# Patient Record
Sex: Male | Born: 2000 | Race: White | Hispanic: No | Marital: Single | Smoking: Never smoker
Health system: Southern US, Community
[De-identification: ages and names within clinical notes are randomized; demographics above are authoritative.]

---
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
## 2022-01-12 ENCOUNTER — Encounter (HOSPITAL_COMMUNITY): Payer: Self-pay

## 2022-01-12 ENCOUNTER — Other Ambulatory Visit: Payer: Self-pay

## 2022-01-12 ENCOUNTER — Emergency Department (HOSPITAL_COMMUNITY): Payer: Commercial Managed Care - PPO

## 2022-01-12 DIAGNOSIS — W502XXA Accidental twist by another person, initial encounter: Secondary | ICD-10-CM | POA: Diagnosis not present

## 2022-01-12 DIAGNOSIS — S93401A Sprain of unspecified ligament of right ankle, initial encounter: Secondary | ICD-10-CM | POA: Insufficient documentation

## 2022-01-12 DIAGNOSIS — Y9301 Activity, walking, marching and hiking: Secondary | ICD-10-CM | POA: Diagnosis not present

## 2022-01-12 DIAGNOSIS — S99911A Unspecified injury of right ankle, initial encounter: Secondary | ICD-10-CM | POA: Diagnosis present

## 2022-01-12 NOTE — ED Triage Notes (Signed)
Pt states he twisted his right ankle today, states its starting to swell and hurts to walk on it.

## 2022-01-13 ENCOUNTER — Emergency Department (HOSPITAL_COMMUNITY)
Admission: EM | Admit: 2022-01-13 | Discharge: 2022-01-13 | Disposition: A | Discharge status: Home / Self Care | Payer: Commercial Managed Care - PPO | Attending: Emergency Medicine | Admitting: Emergency Medicine

## 2022-01-13 DIAGNOSIS — S93401A Sprain of unspecified ligament of right ankle, initial encounter: Secondary | ICD-10-CM

## 2022-01-13 NOTE — Discharge Instructions (Signed)
Keep the ankle iced and elevated.  Take ibuprofen as needed for pain.  Make sure that you are wearing a well fitting shoe.

## 2022-01-13 NOTE — ED Provider Notes (Signed)
[facility] Provider Note   CSN: [id number] Arrival date & time: 01/12/22  2248     History  Chief Complaint  Patient presents with   Ankle Pain    Jerry Ramirez is a 21 y.o. male.  HPI     This is an 21 year old male who presents with right ankle and foot pain.  Patient reports that he twisted his right ankle while walking earlier this evening.  Is painful to walk.  Denies falling or hitting his head.  Denies knee pain.  He took some ibuprofen with some relief.  Denies numbness or tingling.  Home Medications Prior to Admission medications   Medication Sig Start Date End Date Taking? Authorizing Provider  loratadine-pseudoephedrine (CLARITIN-D 12 HOUR) 5-120 MG tablet Take 1 tablet by mouth 2 (two) times daily. 01/28/17   Lily Kocher, PA-C  naproxen (NAPROSYN) 375 MG tablet Take 1 tablet (375 mg total) by mouth 2 (two) times daily as needed. 02/27/20   Petrucelli, Glynda Jaeger, PA-C      Allergies    Patient has no known allergies.    Review of Systems   Review of Systems  Constitutional:  Negative for fever.  Musculoskeletal:        Ankle pain  All other systems reviewed and are negative.  Physical Exam Updated Vital Signs BP 132/90 (BP Location: Right Arm)    Pulse 89    Temp 97.7 F (36.5 C) (Oral)    Resp 15    Ht 1.829 m (6')    Wt 59.7 kg    SpO2 100%    BMI 17.86 kg/m  Physical Exam Vitals and nursing note reviewed.  Constitutional:      Appearance: He is well-developed. He is not ill-appearing.  HENT:     Head: Normocephalic and atraumatic.     Nose: Nose normal.     Mouth/Throat:     Mouth: Mucous membranes are moist.  Eyes:     Pupils: Pupils are equal, round, and reactive to light.  Cardiovascular:     Rate and Rhythm: Normal rate and regular rhythm.  Pulmonary:     Effort: Pulmonary effort is normal. No respiratory distress.  Musculoskeletal:     Comments: Tenderness to palpation over the medial malleolus as well as some  bruising inferiorly minimal swelling, 2+ DP pulse, range of motion limited secondary to pain  Skin:    General: Skin is warm and dry.  Neurological:     Mental Status: He is alert and oriented to person, place, and time.  Psychiatric:        Mood and Affect: Mood normal.    ED Results / Procedures / Treatments   Labs (all labs ordered are listed, but only abnormal results are displayed) Labs Reviewed - No data to display  EKG None  Radiology DG Ankle Complete Right  Result Date: 01/12/2022 CLINICAL DATA:  Ankle injury. twisted his right ankle today while walking down steps, states its starting to swell and hurts to walk on it. Pt denies any previous injury to right ankle. Pt states pain is worse and medial malleolus, bruising visible EXAM: RIGHT ANKLE - COMPLETE 3+ VIEW COMPARISON:  None. FINDINGS: There is no evidence of fracture, dislocation, or joint effusion. There is no evidence of arthropathy or other focal bone abnormality. Soft tissues are unremarkable. IMPRESSION: Negative. Electronically Signed   By: Iven Finn M.D.   On: 01/12/2022 23:25    Procedures Procedures    Medications Ordered  in ED Medications - No data to display  ED Course/ Medical Decision Making/ A&P                           Medical Decision Making Amount and/or Complexity of Data Reviewed Radiology: ordered.   This patient presents to the ED for concern of ankle pain, this involves an extensive number of treatment options, and is a complaint that carries with it a high risk of complications and morbidity.  The differential diagnosis includes sprain, fracture  MDM:    Patient presents with right ankle pain after twisting his ankle.  He is nontoxic and vital signs are reassuring.  Ankle has some bruising inferior to the medial malleolus with some moderate swelling.  Otherwise he is neurovascularly intact.  X-rays reviewed and showed no evidence of acute fracture.  Suspect sprain.  He was provided  with an ankle brace.  Recommend ice and elevation.  Anti-inflammatories. (Labs, imaging)  Labs: I Ordered, and personally interpreted labs.  The pertinent results include: None  Imaging Studies ordered: I ordered imaging studies including x-ray I independently visualized and interpreted imaging. I agree with the radiologist interpretation  Additional history obtained from family.  External records from outside source obtained and reviewed including N/A  Critical Interventions: N/A  Consultations: I requested consultation with the NA,  and discussed lab and imaging findings as well as pertinent plan - they recommend: N/A  Cardiac Monitoring: The patient was maintained on a cardiac monitor.  I personally viewed and interpreted the cardiac monitored which showed an underlying rhythm of: N/A  Reevaluation: After the interventions noted above, I reevaluated the patient and found that they have :improved   Considered admission for: N/A  Social Determinants of Health: Lives independently  Disposition: Discharge  Co morbidities that complicate the patient evaluation History reviewed. No pertinent past medical history.   Medicines No orders of the defined types were placed in this encounter.   I have reviewed the patients home medicines and have made adjustments as needed  Problem List / ED Course: Problem List Items Addressed This Visit   None Visit Diagnoses     Sprain of right ankle, unspecified ligament, initial encounter    -  Primary                   Final Clinical Impression(s) / ED Diagnoses Final diagnoses:  Sprain of right ankle, unspecified ligament, initial encounter    Rx / DC Orders ED Discharge Orders     None         Caelen Reierson, Barbette Hair, MD 01/13/22 0031

## 2023-08-03 IMAGING — DX DG ANKLE COMPLETE 3+V*R*
3 series · 3 of 3 positions shown · non-contrast
Comparison: None.

CLINICAL DATA: Ankle injury. twisted his right ankle today while
walking down steps, states its starting to swell and hurts to walk
on it. Pt denies any previous injury to right ankle. Pt states pain
is worse and medial malleolus, bruising visible

EXAM:
RIGHT ANKLE - COMPLETE 3+ VIEW

[ankle ap]
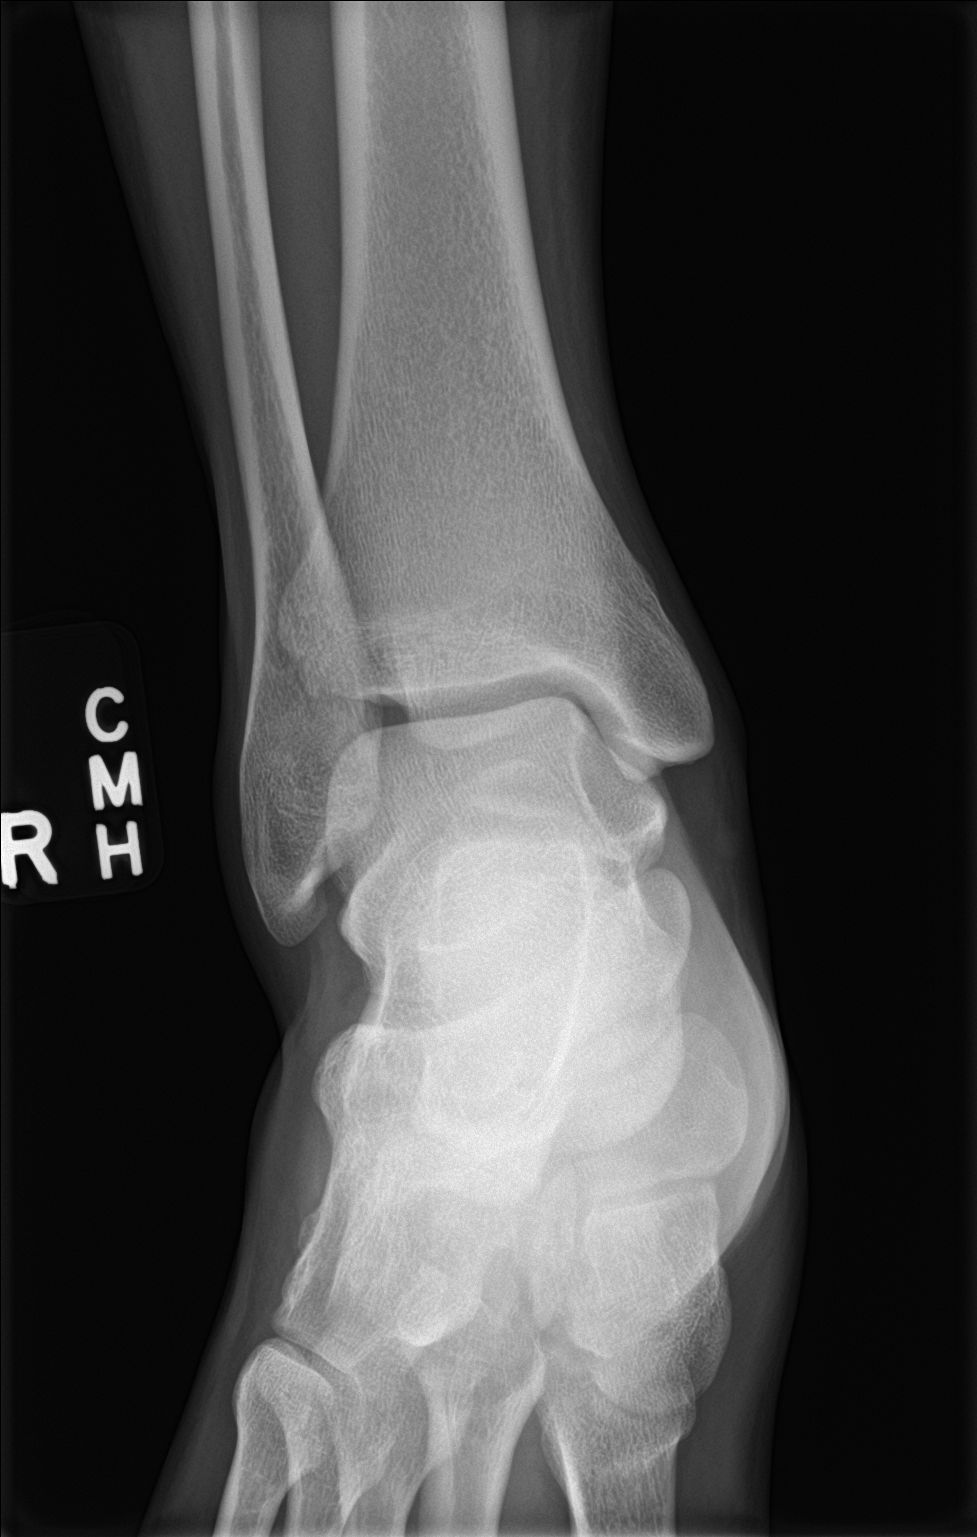

[ankle obl]
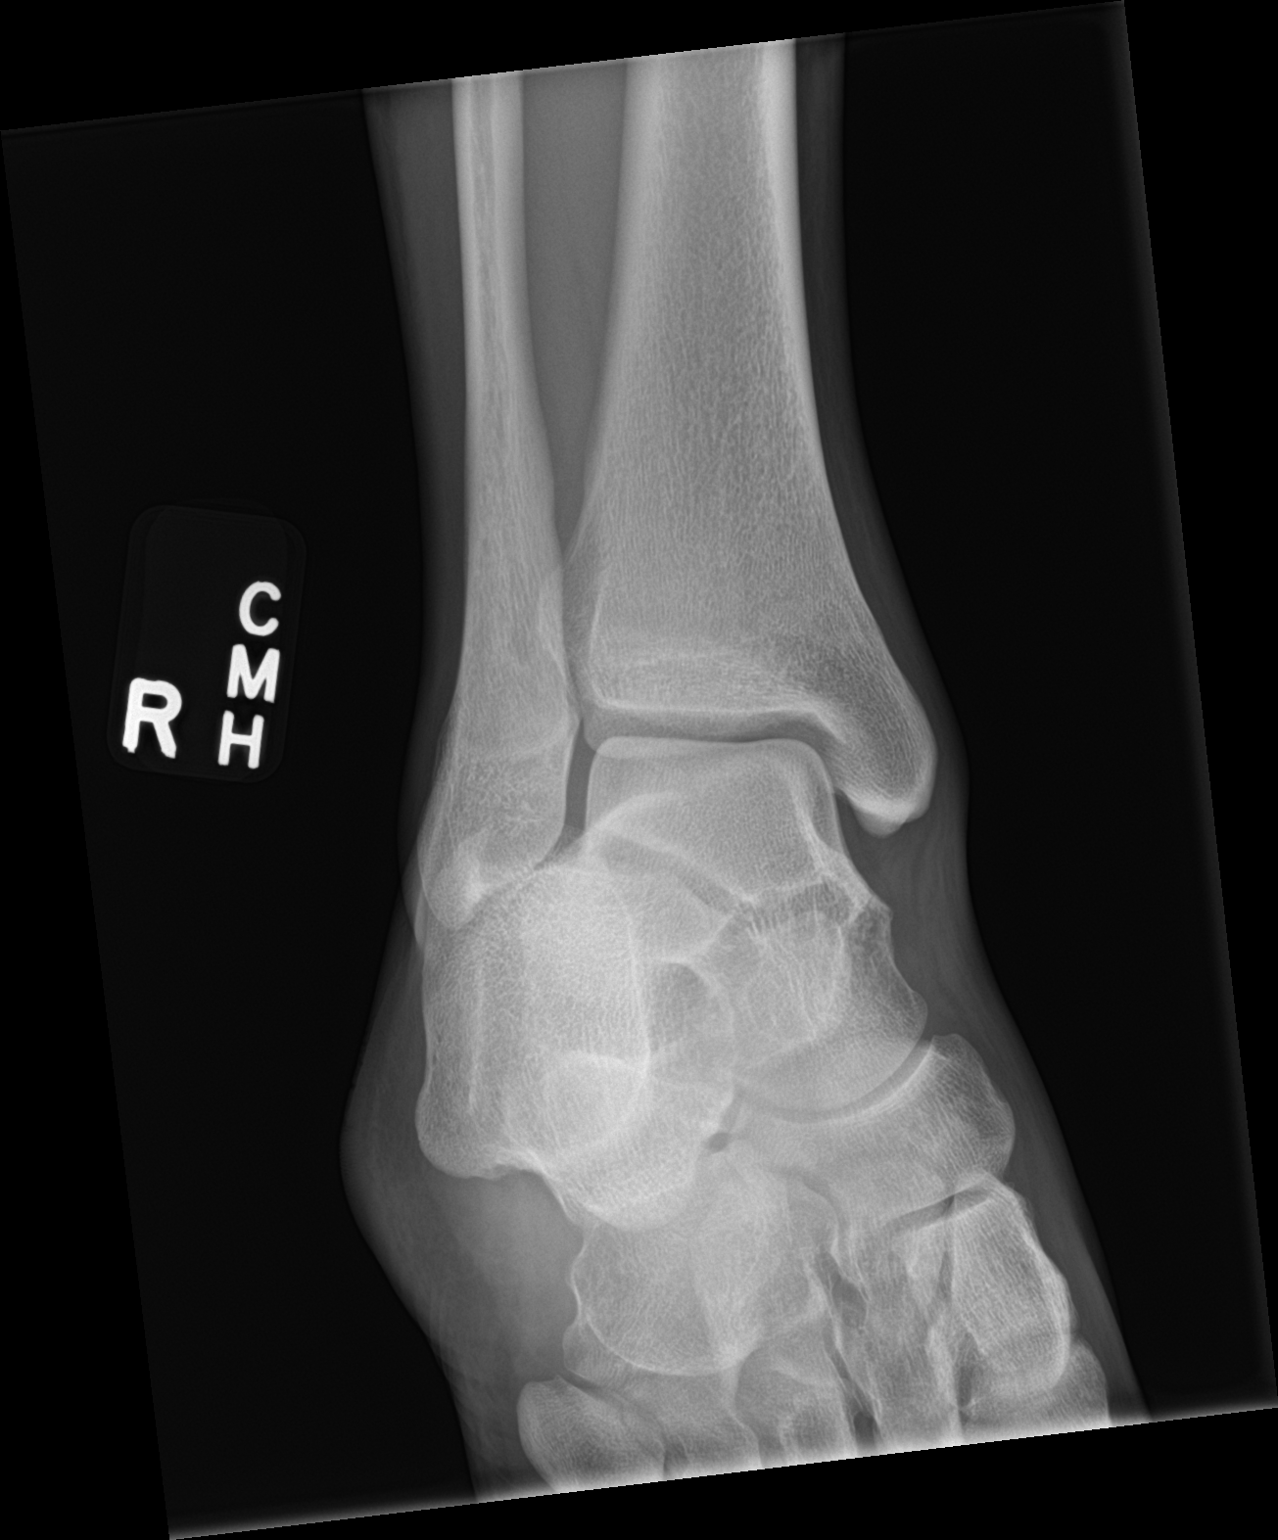

[ankle lat]
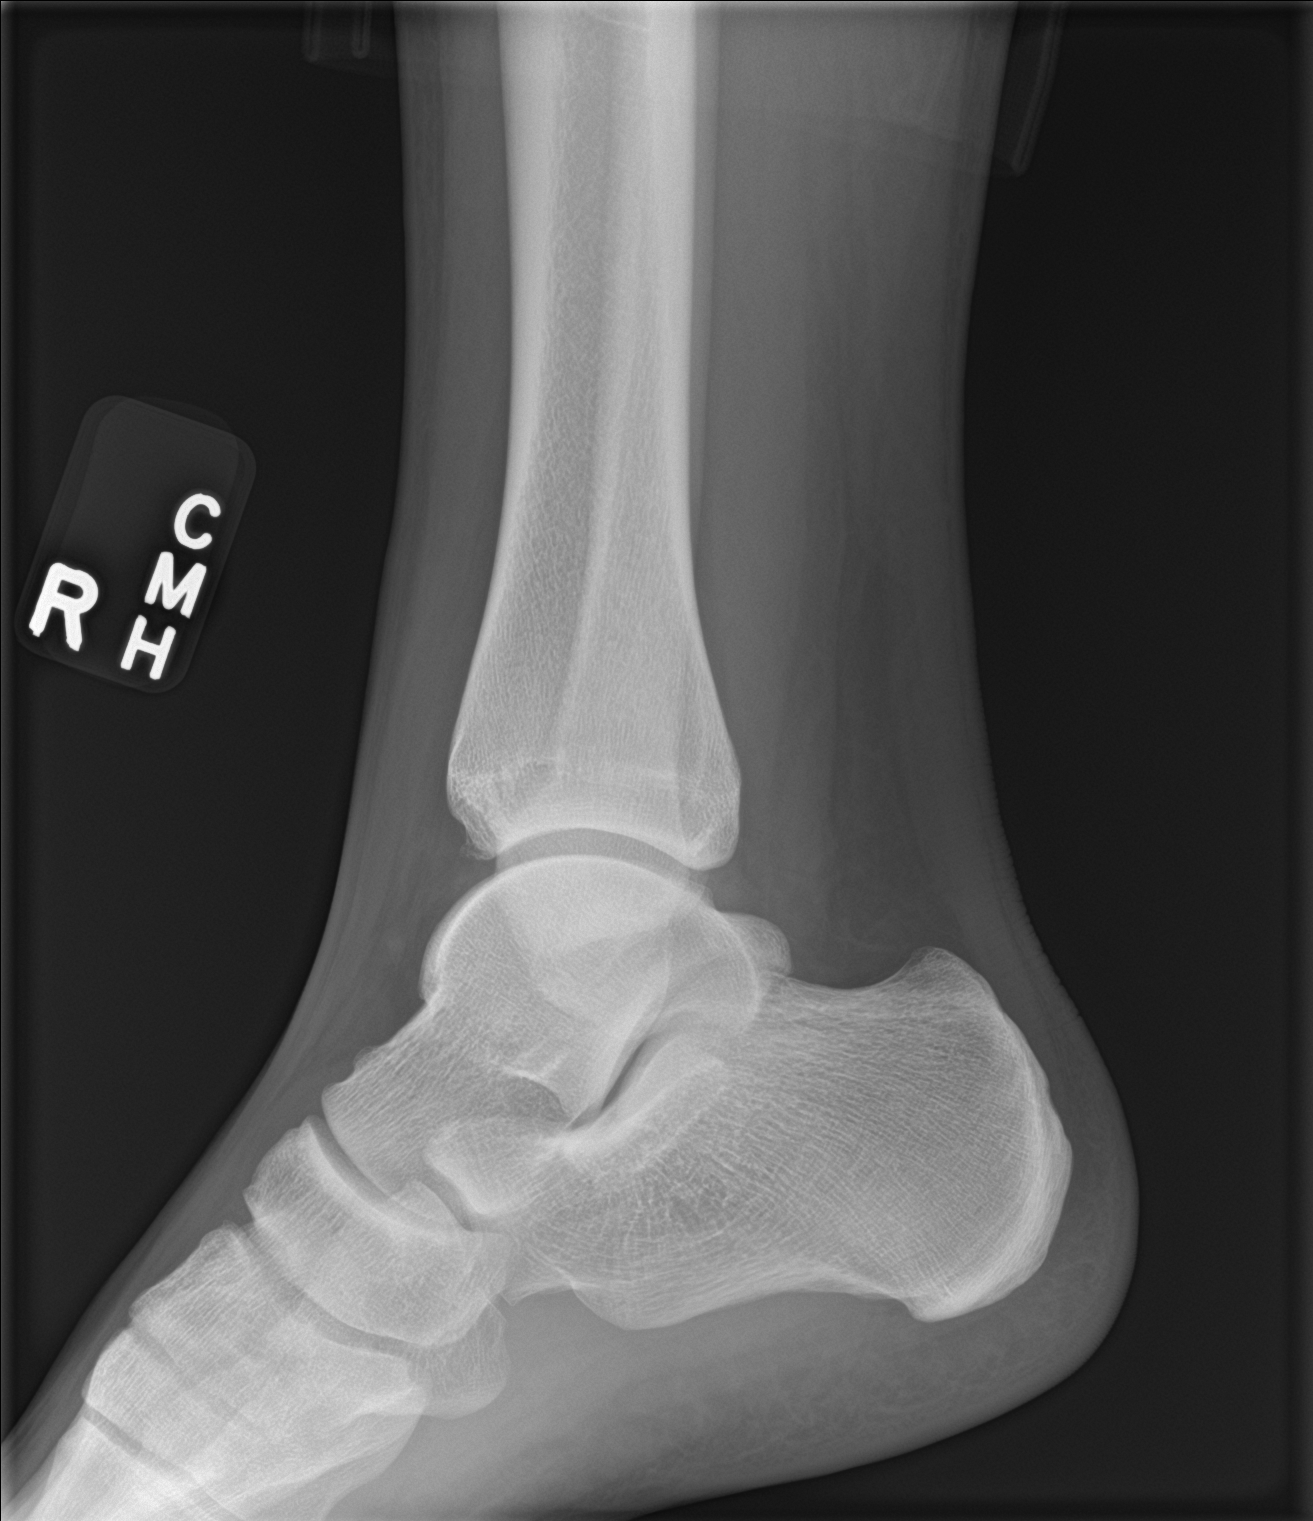

[3 of 3 positions shown; findings below may reference images not displayed]

FINDINGS: There is no evidence of fracture, dislocation, or joint effusion.
There is no evidence of arthropathy or other focal bone abnormality.
Soft tissues are unremarkable.
IMPRESSION: Negative.
